# Patient Record
Sex: Female | Born: 2000 | Race: Black or African American | Hispanic: No | Marital: Single | Smoking: Former smoker
Health system: Southern US, Community
[De-identification: ages and names within clinical notes are randomized; demographics above are authoritative.]

## PROBLEM LIST (undated history)

## (undated) DIAGNOSIS — K5900 Constipation, unspecified: Secondary | ICD-10-CM

## (undated) HISTORY — DX: Constipation, unspecified: K59.00

---
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
## 2021-01-16 ENCOUNTER — Ambulatory Visit: Payer: Medicaid Other | Admitting: Adult Health

## 2021-08-13 ENCOUNTER — Ambulatory Visit: Payer: Medicaid Other | Admitting: Women's Health

## 2021-08-20 ENCOUNTER — Ambulatory Visit: Payer: Medicaid Other | Admitting: Women's Health

## 2021-11-14 IMAGING — CT CT ABD-PELV W/ CM
2 of 4 series · 16 of 46 positions shown, 18 images · IV contrast (Omnipaque or Isovue)
Comparison: None.

CLINICAL DATA: Right lower quadrant and groin pain since going to
bed last night.

EXAM:
CT ABDOMEN AND PELVIS WITH CONTRAST
TECHNIQUE: Multidetector CT imaging of the abdomen and pelvis was performed
using the standard protocol following bolus administration of
intravenous contrast.
CONTRAST:  100mL OMNIPAQUE IOHEXOL 300 MG/ML  SOLN

[Series 2: axial st · axial · 0.70mm/px · z∈[+799,+1254]mm · 13 of 99 slices shown, 15 images]
[im 4/99  soft-tissue]
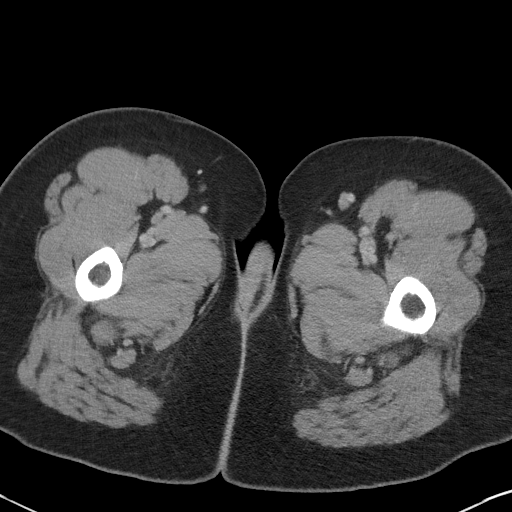
[im 4/99  bone]
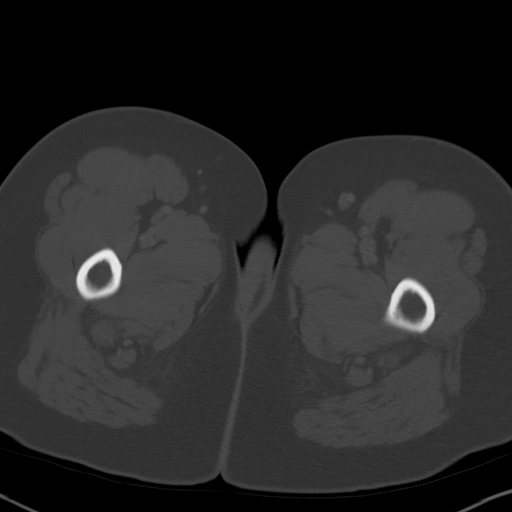
[im 12/99  soft-tissue]
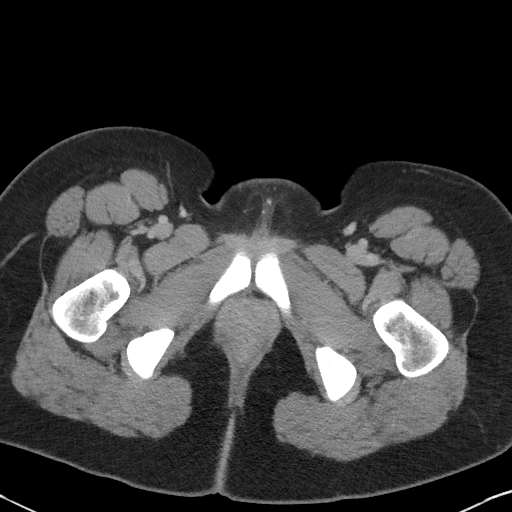
[im 19/99  soft-tissue]
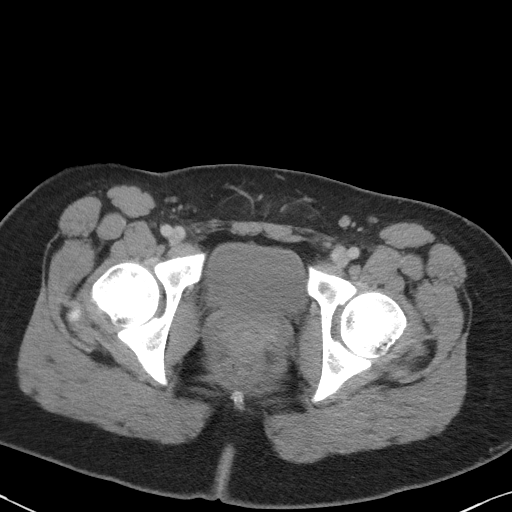
[im 27/99  soft-tissue]
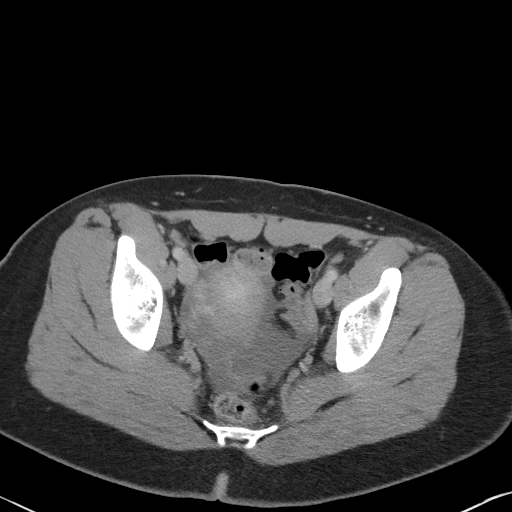
[im 34/99  soft-tissue]
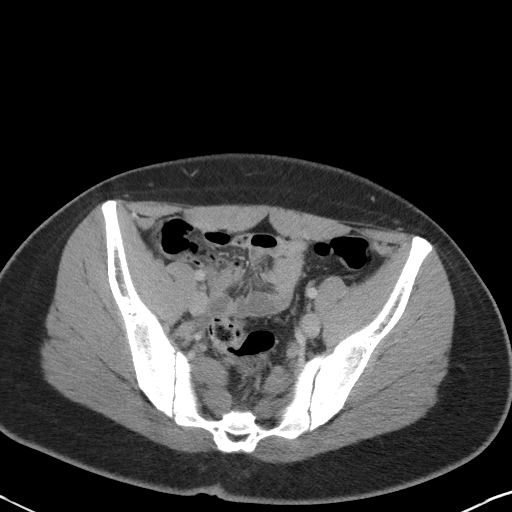
[im 42/99  soft-tissue]
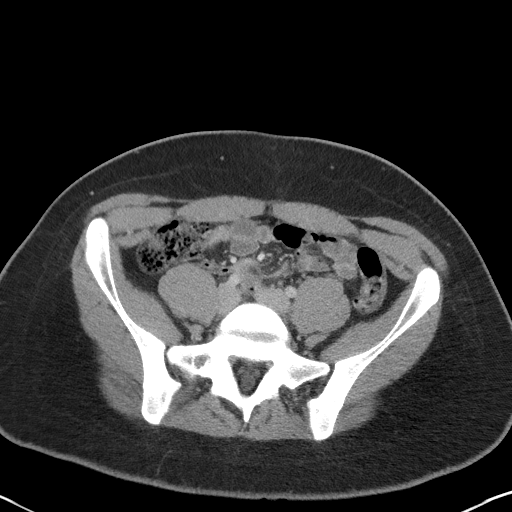
[im 50/99  soft-tissue]
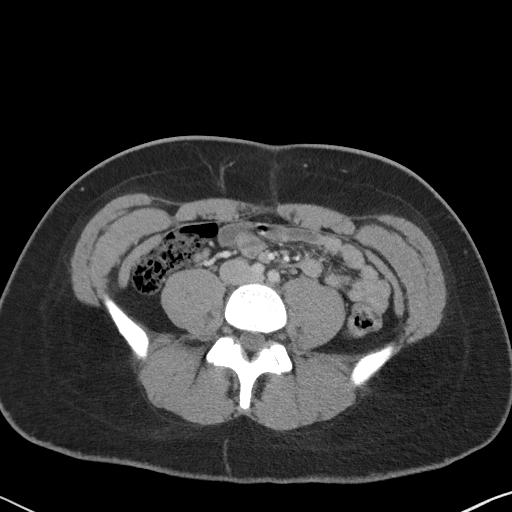
[im 57/99  soft-tissue]
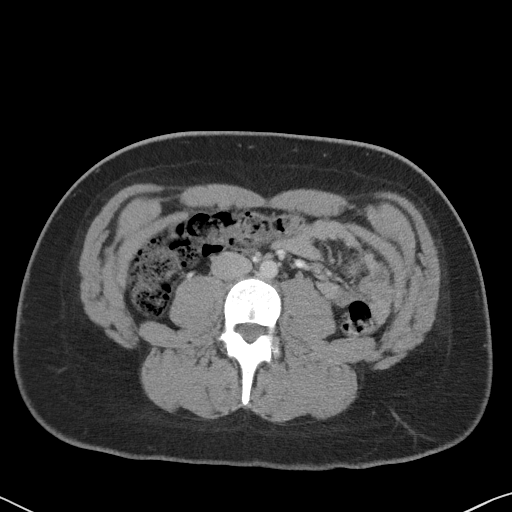
[im 65/99  soft-tissue]
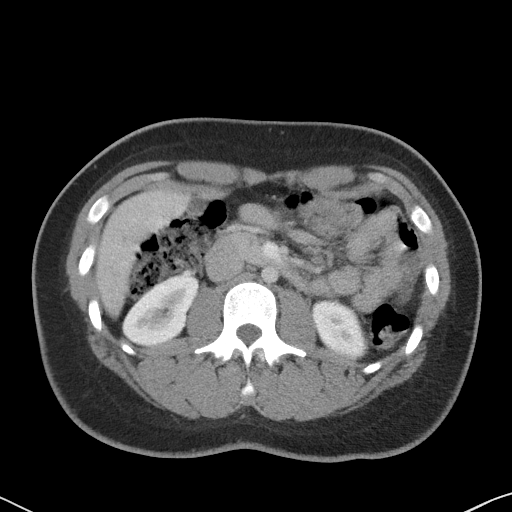
[im 65/99  bone]
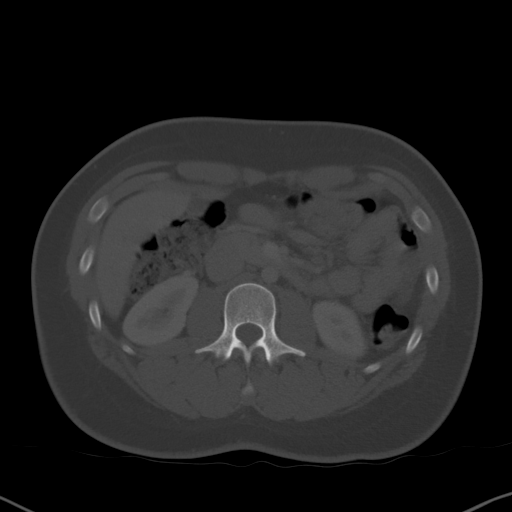
[im 72/99  soft-tissue]
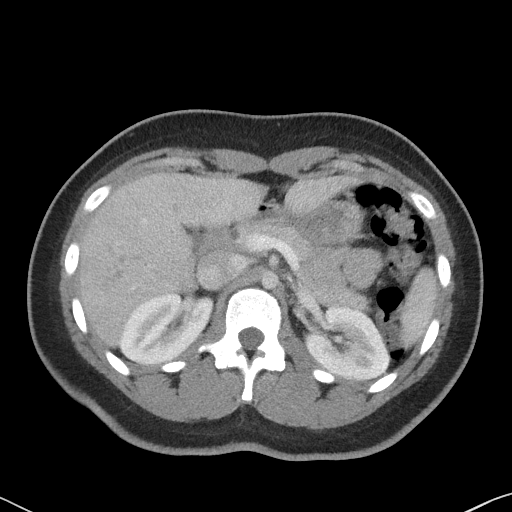
[im 80/99  soft-tissue]
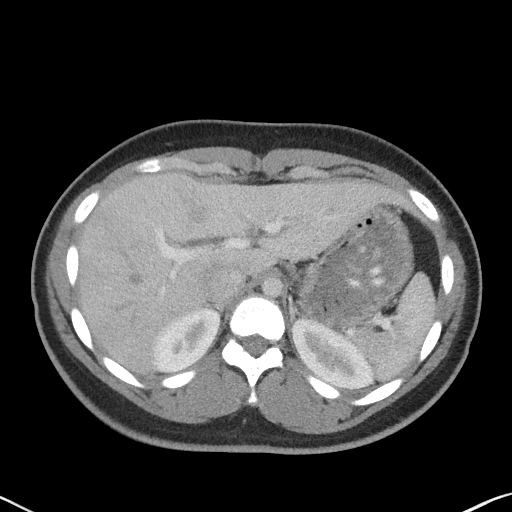
[im 87/99  soft-tissue]
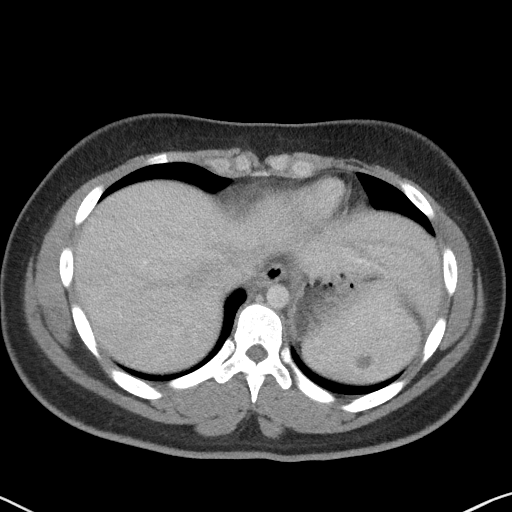
[im 95/99  soft-tissue]
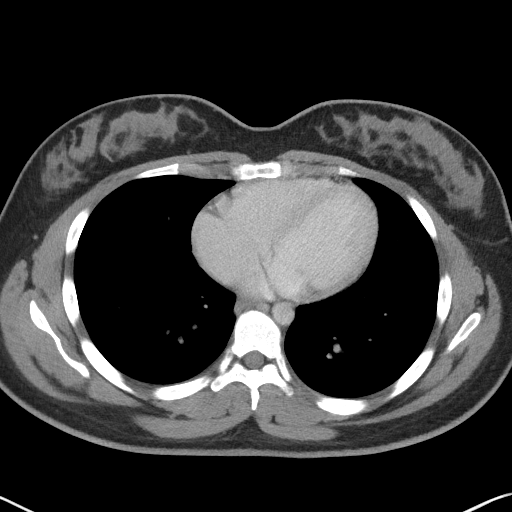

[Series 5: coronal st · coronal · 0.72mm/px · 3 of 94 slices shown]
[im 32/94  soft-tissue]
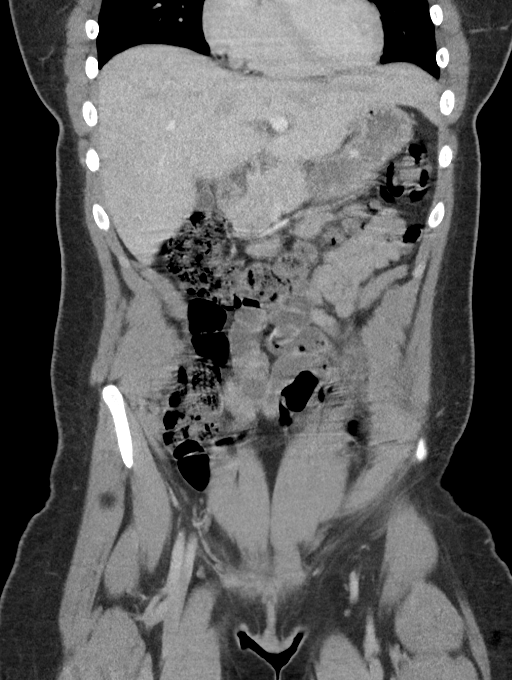
[im 42/94  soft-tissue]
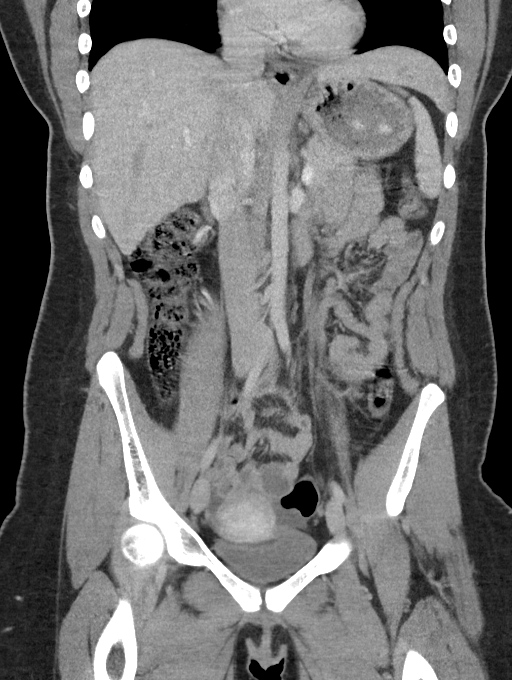
[im 52/94  soft-tissue]
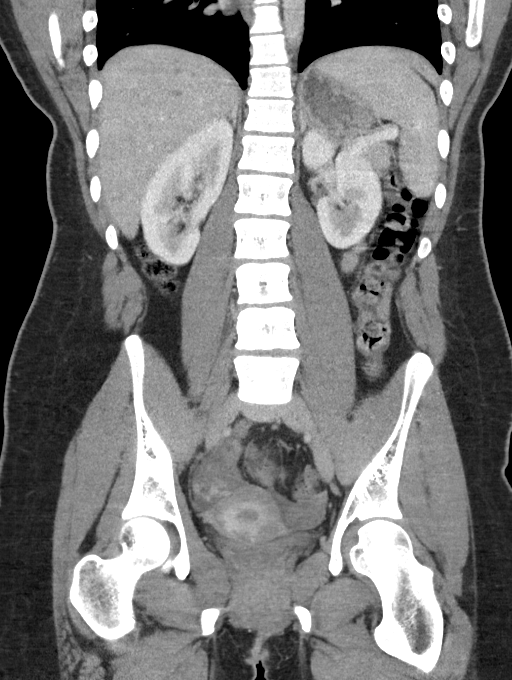

[16 of 46 positions shown; findings below may reference images not displayed]

FINDINGS: Lower chest: No significant pulmonary nodules or acute consolidative
airspace disease.

Hepatobiliary: Normal liver size. No liver mass. Normal gallbladder
with no radiopaque cholelithiasis. No biliary ductal dilatation.

Pancreas: Normal, with no mass or duct dilation.

Spleen: Normal size spleen. Subcentimeter hypodense splenic lesion,
too small to characterize. No additional splenic lesions.

Adrenals/Urinary Tract: Normal adrenals. Normal kidneys, with no
contour deforming renal lesions and no hydronephrosis. Normal
bladder.

Stomach/Bowel: Normal non-distended stomach. Normal caliber small
bowel with no small bowel wall thickening. Normal appendix. Normal
large bowel with no diverticulosis, large bowel wall thickening or
pericolonic fat stranding.

Vascular/Lymphatic: Normal caliber abdominal aorta. Patent portal,
splenic and renal veins. No pathologically enlarged lymph nodes in
the abdomen or pelvis.

Reproductive: Normal uterus.  No discrete adnexal masses.

Other: No pneumoperitoneum. No focal fluid collections. Mildly
heterogeneously dense small volume free fluid in the pelvic
cul-de-sac and right adnexa.

Musculoskeletal: No aggressive appearing focal osseous lesions.
IMPRESSION: 1. Mildly heterogeneously dense small volume free fluid in the
pelvic cul-de-sac and right adnexa, nonspecific, probably
physiologic. No discrete adnexal masses.
2. Normal appendix. No evidence of bowel obstruction or acute bowel
inflammation.

## 2022-08-16 IMAGING — DX DG CHEST 1V PORT
1 series · 1 of 1 positions shown · non-contrast
Comparison: 03/04/2020

CLINICAL DATA: Shortness of breath

EXAM:
PORTABLE CHEST 1 VIEW

[chest ap]
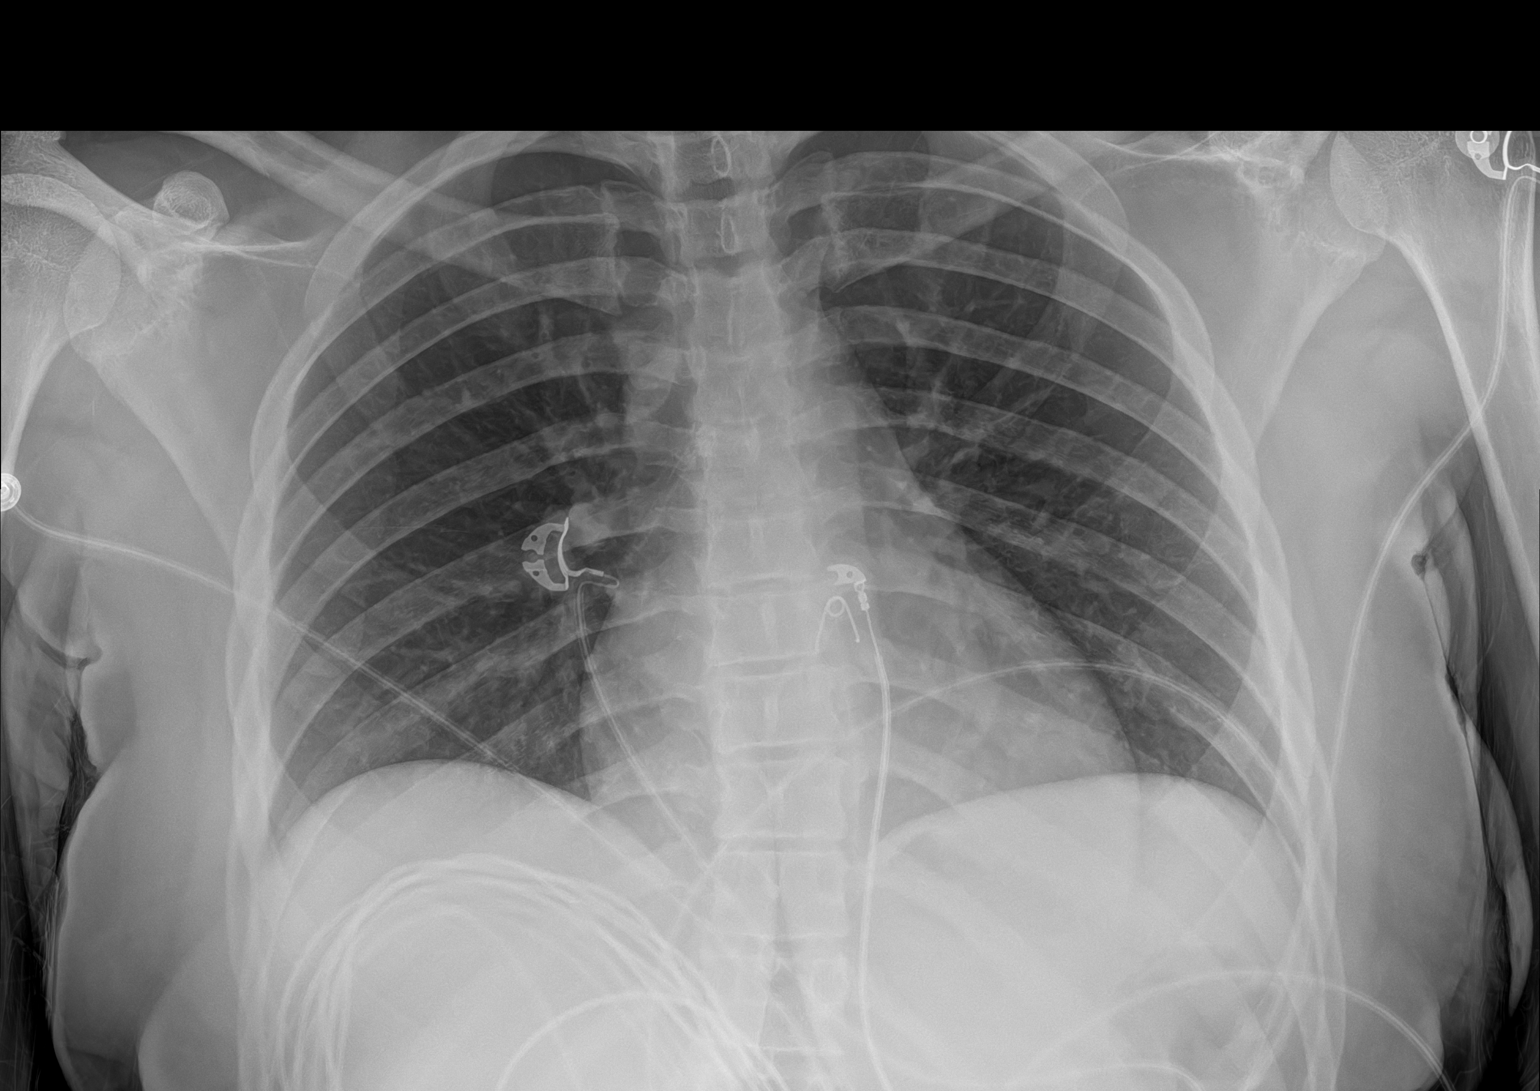

[1 of 1 positions shown; findings below may reference images not displayed]

FINDINGS: Normal heart size and mediastinal contours. No acute infiltrate or
edema. No effusion or pneumothorax. No acute osseous findings.

Artifact from EKG leads.
IMPRESSION: No active disease.
